# Patient Record
Sex: Male | Born: 1985 | Hispanic: Yes | Marital: Single | Smoking: Never smoker
Health system: Southern US, Community
[De-identification: ages and names within clinical notes are randomized; demographics above are authoritative.]

## PROBLEM LIST (undated history)

## (undated) DIAGNOSIS — Z789 Other specified health status: Secondary | ICD-10-CM

## (undated) HISTORY — DX: Other specified health status: Z78.9

---

## 2017-05-16 ENCOUNTER — Other Ambulatory Visit: Payer: Self-pay | Admitting: Internal Medicine

## 2017-05-16 DIAGNOSIS — Z8249 Family history of ischemic heart disease and other diseases of the circulatory system: Secondary | ICD-10-CM

## 2017-05-23 ENCOUNTER — Ambulatory Visit
Admission: RE | Admit: 2017-05-23 | Discharge: 2017-05-23 | Disposition: A | Discharge status: Home / Self Care | Payer: BLUE CROSS/BLUE SHIELD | Source: Ambulatory Visit | Attending: Internal Medicine | Admitting: Internal Medicine

## 2017-05-23 DIAGNOSIS — I719 Aortic aneurysm of unspecified site, without rupture: Secondary | ICD-10-CM | POA: Diagnosis not present

## 2017-05-23 DIAGNOSIS — Z8249 Family history of ischemic heart disease and other diseases of the circulatory system: Secondary | ICD-10-CM

## 2017-05-23 DIAGNOSIS — Z136 Encounter for screening for cardiovascular disorders: Secondary | ICD-10-CM | POA: Diagnosis not present

## 2017-05-24 ENCOUNTER — Telehealth: Payer: Self-pay | Admitting: Cardiovascular Disease

## 2017-05-24 NOTE — Telephone Encounter (Signed)
Received records from [facility] for appointment on 06/19/17 with Dr Luling.  Records put with Dr [place]'s schedule for 06/19/17. lp °

## 2017-06-19 ENCOUNTER — Encounter: Payer: Self-pay | Admitting: Cardiovascular Disease

## 2017-06-19 ENCOUNTER — Ambulatory Visit (INDEPENDENT_AMBULATORY_CARE_PROVIDER_SITE_OTHER): Payer: BLUE CROSS/BLUE SHIELD | Admitting: Cardiovascular Disease

## 2017-06-19 VITALS — BP 125/77 | HR 72 | Ht 71.0 in | Wt 155.4 lb

## 2017-06-19 DIAGNOSIS — Z01812 Encounter for preprocedural laboratory examination: Secondary | ICD-10-CM | POA: Diagnosis not present

## 2017-06-19 DIAGNOSIS — Z136 Encounter for screening for cardiovascular disorders: Secondary | ICD-10-CM

## 2017-06-19 DIAGNOSIS — Z8249 Family history of ischemic heart disease and other diseases of the circulatory system: Secondary | ICD-10-CM | POA: Diagnosis not present

## 2017-06-19 NOTE — Patient Instructions (Signed)
Labwork: BMET TODAY   Testing/Procedures: Non-Cardiac CT Angiography (CTA), is a special type of CT scan that uses a computer to produce multi-dimensional views of major blood vessels throughout the body. In CT angiography, a contrast material is injected through an IV to help visualize the blood vessels CTA CHEST   Follow-Up: AS NEEDED   If you need a refill on your cardiac medications before your next appointment, please call your pharmacy.

## 2017-06-19 NOTE — Progress Notes (Signed)
Cardiology Office Note   Date:  06/19/2017   ID:  Matthew Curtis, DOB Mar 04, 1986, MRN [id number]  PCP:  Gaspar Garbe, MD  Cardiologist:   Chilton Si, MD   Chief Complaint  Patient presents with  . New Patient (Initial Visit)    Pt states no Sx. Check up regarding family history.      History of Present Illness: Matthew Curtis is a 31 y.o. male with no past medical history who is being seen today for the evaluation of family history of CAD at the request of Tisovec, Adelfa Koh, MD.  Matthew Curtis brother had a thoracic aortic aneurysm at age 71.  He developed severe back pain and was found to have a severe thoracic aortic aneurysm that required emergency surgery.  He saw Dr. Wylene Curtis and reported that his brother had a AAA.  He was referred for an abdominal aortic ultrasound 05/23/17 that was negative for aortic aneurysm.  However, he later learned that it was actually a thoracic aneurysm. He reports that his brother had an incision that went from his sternum and into the abdomen.  He is unsure whether it was an ascending aneurysm.    Matthew Curtis has been feeling well.  He currently denies chest pain or shortness of breath.  He exercises at the gym 5 days per week and has no exertional symptoms.  Six months ago he had left sided chest pain in the setting of having some pustules in that region of his skin. He denies any exertional symptoms or recent chest pain.  He does not smoke and drinks alcohol 1-2 per week.  He denies any hyperextensible joints or skin.    Past Medical History:  Diagnosis Date  . Medical history non-contributory     No past surgical history on file.   No current outpatient prescriptions on file.   No current facility-administered medications for this visit.     Allergies:   Patient has no known allergies.    Social History:  The patient  reports that he has never smoked. He has never used smokeless tobacco.   Family History:  The patient's family  history includes Aortic aneurysm in his brother; Diabetes in his father, maternal grandfather, and maternal grandmother; Hypertension in his father; Osteoporosis in his maternal grandmother; Prostate cancer in his father and paternal grandfather; Stroke in his maternal grandfather.    ROS:  Please see the history of present illness.   Otherwise, review of systems are positive for none.   All other systems are reviewed and negative.    PHYSICAL EXAM: VS:  BP 125/77   Pulse 72   Ht 5\' 11"  (1.803 m)   Wt 70.5 kg (155 lb 6.4 oz)   BMI 21.67 kg/m  , BMI Body mass index is 21.67 kg/m. GENERAL:  Well appearing HEENT:  Pupils equal round and reactive, fundi not visualized, oral mucosa unremarkable.  No biphid uvula NECK:  No jugular venous distention, waveform within normal limits, carotid upstroke brisk and symmetric, no bruits, no thyromegaly LUNGS:  Clear to auscultation bilaterally HEART:  RRR.  PMI not displaced or sustained,S1 and S2 within normal limits, no S3, no S4, no clicks, no rubs, no murmurs ABD:  Flat, positive bowel sounds normal in frequency in pitch, no bruits, no rebound, no guarding, no midline pulsatile mass, no hepatomegaly, no splenomegaly EXT:  2 plus pulses throughout, no edema, no cyanosis no clubbing SKIN:  No rashes no nodules.  No hyperelasticity NEURO:  Cranial nerves II through XII grossly intact, motor grossly intact throughout PSYCH:  Cognitively intact, oriented to person place and time   EKG:  EKG is ordered today. The ekg ordered today demonstrates sinus arrhythmia. Rate 72 bpm.   Recent Labs: No results found for requested labs within last 8760 hours.   05/23/17: Sodium 141, potassium 4.3, BUN 23, creatinine 1.1 AST 21, ALT 18 WBC 7, hemoglobin 15.2, hematocrit 44, platelets 300 Total cholesterol 170, triglycerides 110, HDL 56, LDL 92  Lipid Panel No results found for: CHOL, TRIG, HDL, CHOLHDL, VLDL, LDLCALC, LDLDIRECT    Wt Readings from Last 3  Encounters:  06/19/17 70.5 kg (155 lb 6.4 oz)      ASSESSMENT AND PLAN:  # Family history of aortic aneurysm:  Matthew Curtis 34 year old brother recenty had a thoracic aortic aneurysm that required emergency surgery.  He is unsure of whether is was an ascending or descending aneurysm and he doesn't think he had a bicuspid aortic valve.  Matthew Curtis had an AAA that was negative for dissection, but this wouldn't have shown anything in the thoracic cavity.  We will get a chest CT-A to look at the ascending and descending aorta.  He doesn't have any evidence of connective tissue disease on exam.  # Atypical chest pain: Episode occurred 6 months ago and hasn't recurred recently despite regular exercise.  No ischemia evaluation.    Current medicines are reviewed at length with the patient today.  The patient does not have concerns regarding medicines.  The following changes have been made:  no change  Labs/ tests ordered today include:   Orders Placed This Encounter  Procedures  . CT ANGIO CHEST AORTA W/CM &/OR WO/CM  . Basic metabolic panel     Disposition:   FU with Altagracia Rone C. Duke Salvia, MD, [facility] as needed.     This note was written with the assistance of speech recognition software.  Please excuse any transcriptional errors.  Signed, Abdinasir Spadafore C. Duke Salvia, MD, [facility]  06/19/2017 2:35 PM    [facility]

## 2017-06-19 NOTE — Addendum Note (Signed)
Addended by: Regis Bill B on: 06/19/2017 06:04 PM   Modules accepted: Orders

## 2017-06-26 ENCOUNTER — Ambulatory Visit (INDEPENDENT_AMBULATORY_CARE_PROVIDER_SITE_OTHER)
Admission: RE | Admit: 2017-06-26 | Discharge: 2017-06-26 | Disposition: A | Discharge status: Home / Self Care | Payer: BLUE CROSS/BLUE SHIELD | Source: Ambulatory Visit | Attending: Cardiovascular Disease | Admitting: Cardiovascular Disease

## 2017-06-26 DIAGNOSIS — Z136 Encounter for screening for cardiovascular disorders: Secondary | ICD-10-CM

## 2017-06-26 DIAGNOSIS — Z8249 Family history of ischemic heart disease and other diseases of the circulatory system: Secondary | ICD-10-CM

## 2017-06-26 DIAGNOSIS — I712 Thoracic aortic aneurysm, without rupture: Secondary | ICD-10-CM | POA: Diagnosis not present

## 2017-06-26 MED ORDER — IOPAMIDOL (ISOVUE-370) INJECTION 76%
100.0000 mL | Freq: Once | INTRAVENOUS | Status: AC | PRN
  Administered 2017-06-26: 100 mL via INTRAVENOUS

## 2017-07-03 ENCOUNTER — Telehealth: Payer: Self-pay | Admitting: Cardiovascular Disease

## 2017-07-03 NOTE — Telephone Encounter (Signed)
Returned call and made aware of results, verbalized understanding.

## 2017-07-03 NOTE — Telephone Encounter (Signed)
New message     Pt is returning [place] call from Friday

## 2018-05-10 DIAGNOSIS — R82998 Other abnormal findings in urine: Secondary | ICD-10-CM | POA: Diagnosis not present

## 2018-05-10 DIAGNOSIS — Z0000 Encounter for general adult medical examination without abnormal findings: Secondary | ICD-10-CM | POA: Diagnosis not present

## 2018-05-17 DIAGNOSIS — Z0000 Encounter for general adult medical examination without abnormal findings: Secondary | ICD-10-CM | POA: Diagnosis not present

## 2018-05-17 DIAGNOSIS — Z1331 Encounter for screening for depression: Secondary | ICD-10-CM | POA: Diagnosis not present

## 2018-05-17 DIAGNOSIS — F418 Other specified anxiety disorders: Secondary | ICD-10-CM | POA: Diagnosis not present

## 2018-05-17 DIAGNOSIS — Z1389 Encounter for screening for other disorder: Secondary | ICD-10-CM | POA: Diagnosis not present

## 2018-05-17 DIAGNOSIS — Z8249 Family history of ischemic heart disease and other diseases of the circulatory system: Secondary | ICD-10-CM | POA: Diagnosis not present

## 2018-07-16 DIAGNOSIS — Z23 Encounter for immunization: Secondary | ICD-10-CM | POA: Diagnosis not present

## 2018-08-09 IMAGING — US US ABDOMINAL AORTA SCREENING AAA
1 series · 14 of 15 positions shown · non-contrast
Comparison: None.

CLINICAL DATA: Family history of aortic aneurysm.

EXAM:
ULTRASOUND OF ABDOMINAL AORTA
TECHNIQUE: Ultrasound examination of the abdominal aorta was performed to
evaluate for abdominal aortic aneurysm.

[Series 1: us abdominal aorta screening aaa · 0.17mm/px · 14 of 15 slices shown]
[im 1/15]
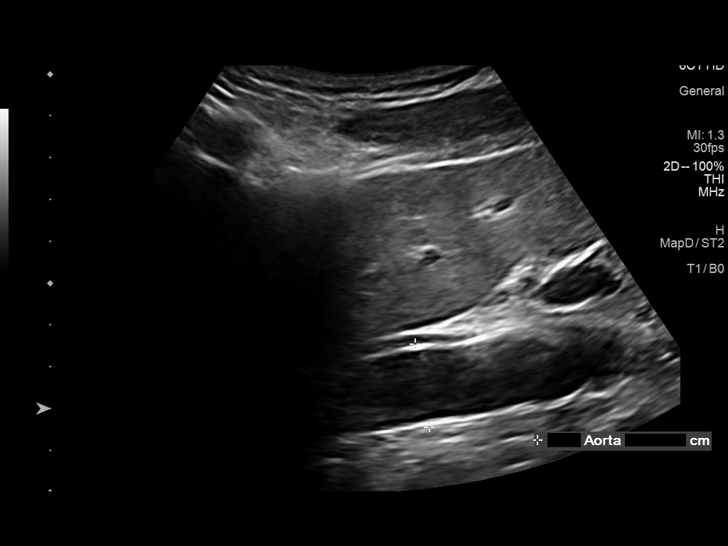
[im 2/15]
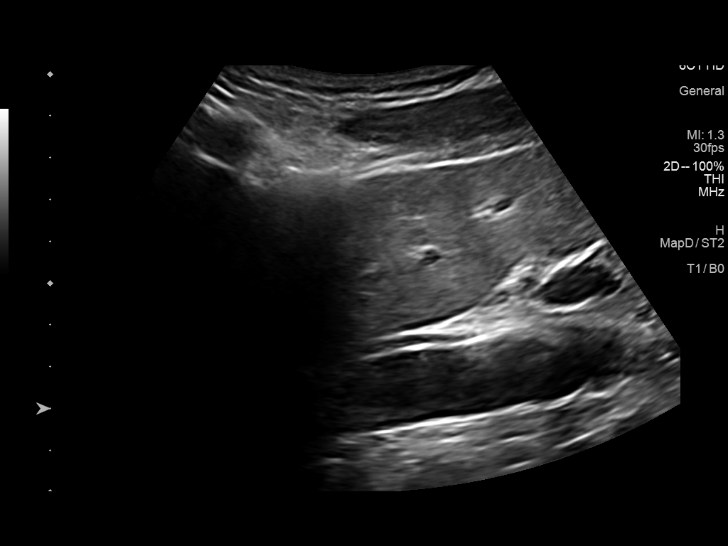
[im 3/15]
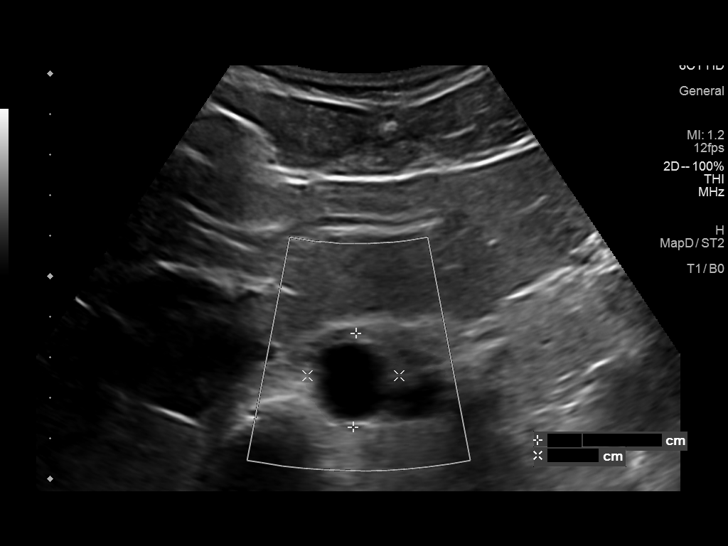
[im 4/15]
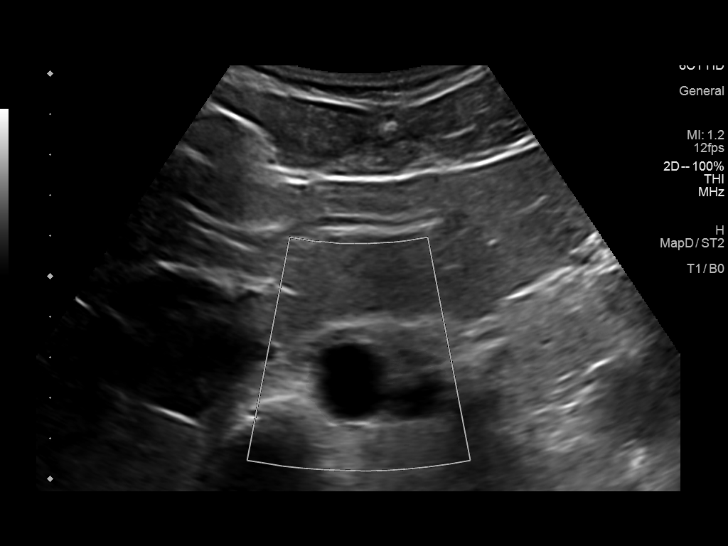
[im 5/15]
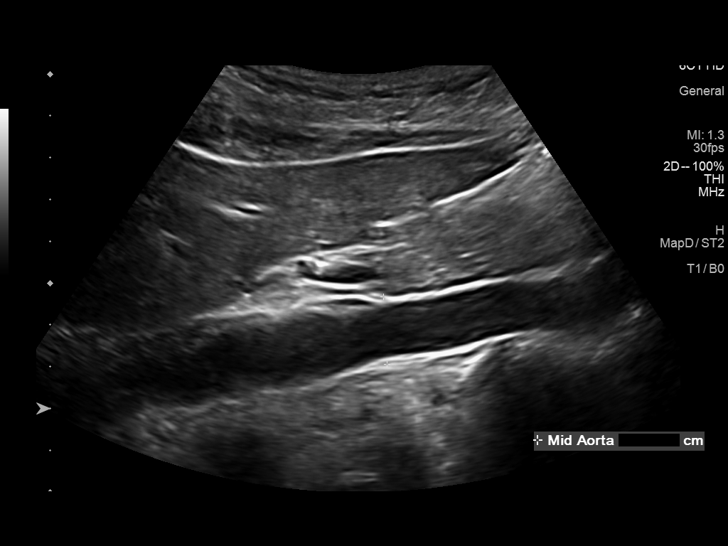
[im 6/15]
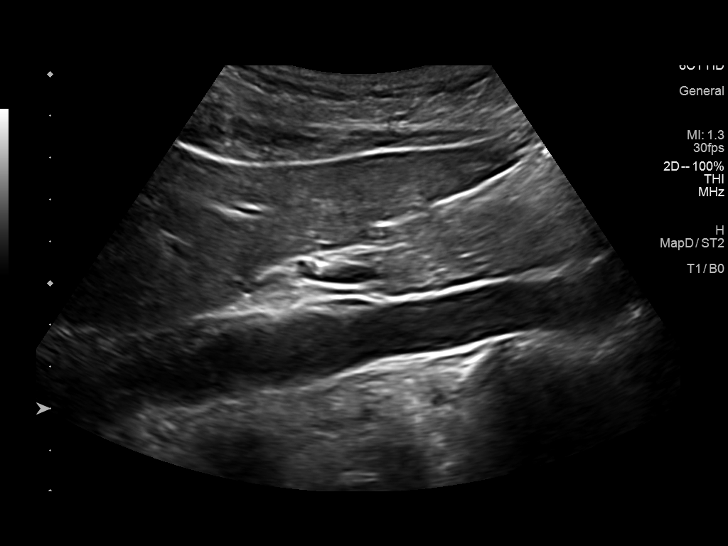
[im 7/15]
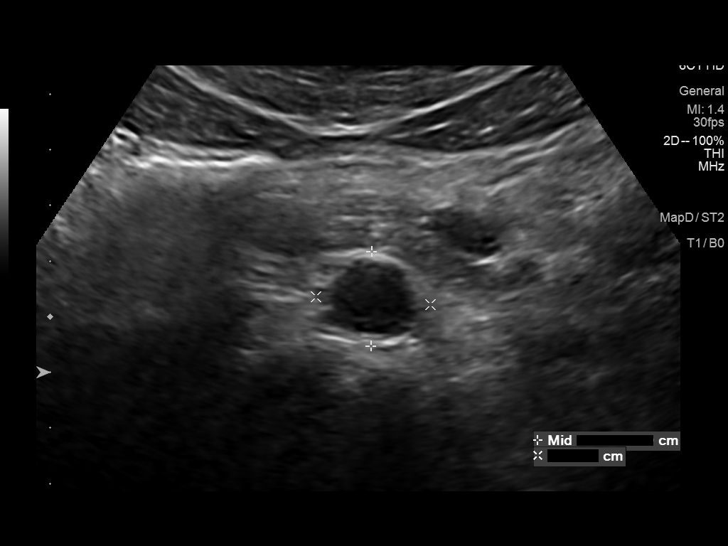
[im 9/15]
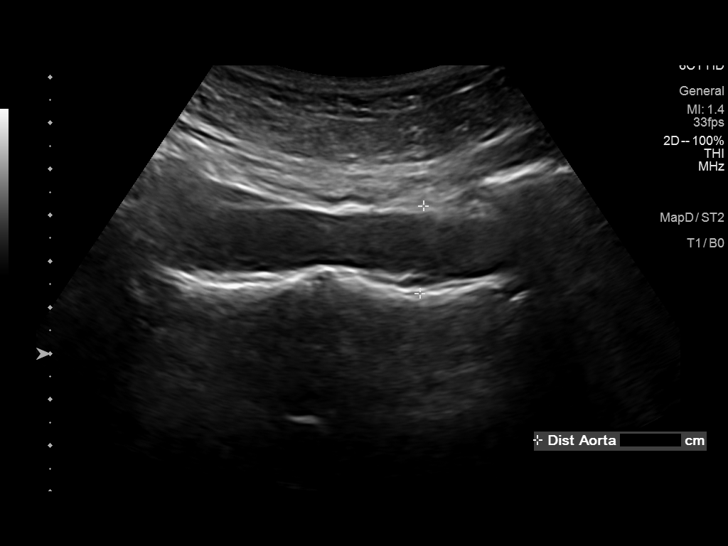
[im 10/15]
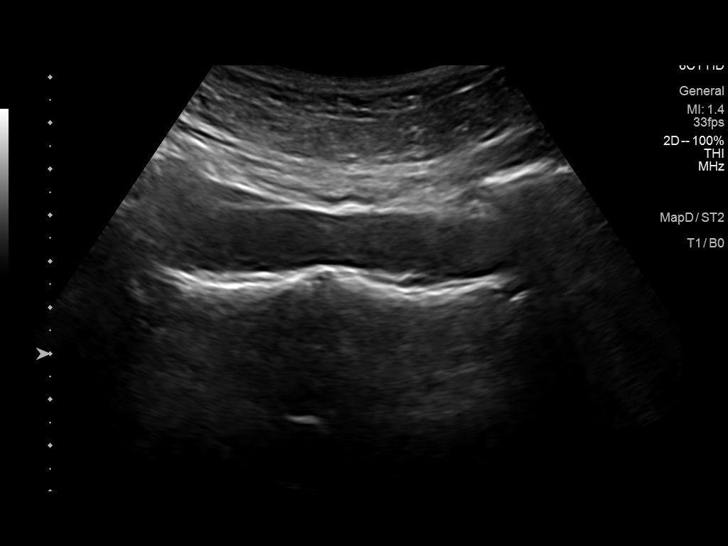
[im 11/15]
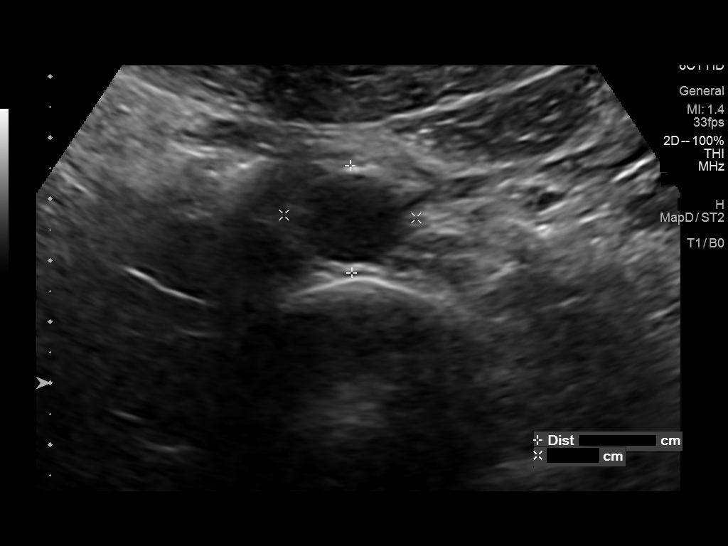
[im 12/15]
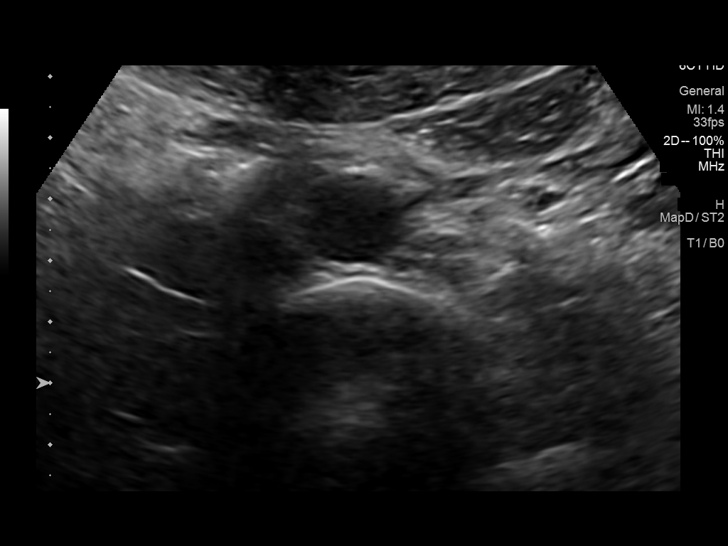
[im 13/15]
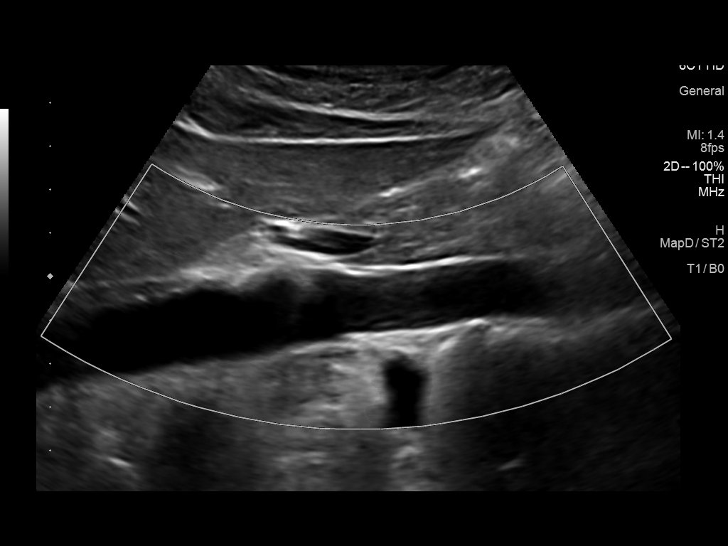
[im 14/15]
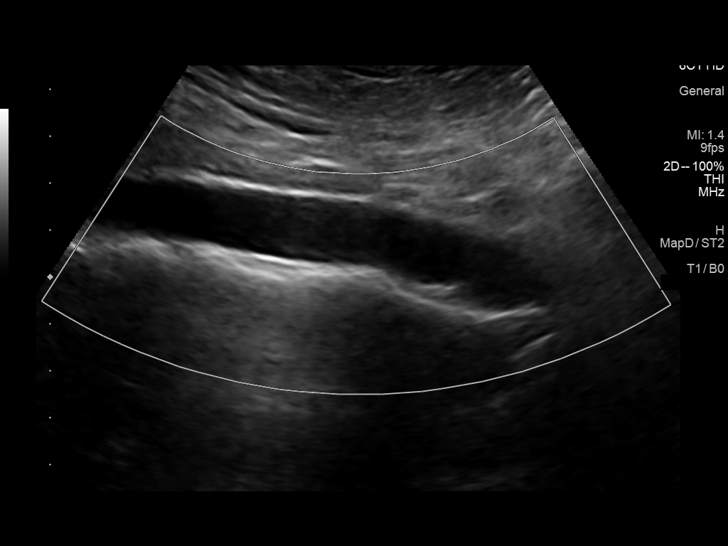
[im 15/15]
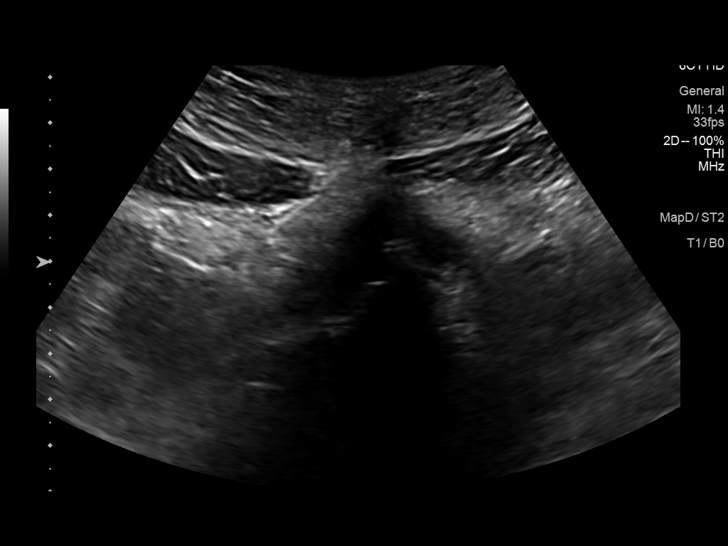

[14 of 15 positions shown; findings below may reference images not displayed]

FINDINGS: Abdominal aortic measurements as follows:

Proximal:  2.3 cm

Mid:  2.1 cm

Distal:  2.2 cm
IMPRESSION: No evidence of aortic aneurysm.

## 2018-09-25 ENCOUNTER — Encounter: Payer: Self-pay | Admitting: Family Medicine

## 2018-09-25 ENCOUNTER — Ambulatory Visit (INDEPENDENT_AMBULATORY_CARE_PROVIDER_SITE_OTHER): Payer: BLUE CROSS/BLUE SHIELD | Admitting: Family Medicine

## 2018-09-25 VITALS — BP 131/86 | Ht 71.0 in | Wt 155.0 lb

## 2018-09-25 DIAGNOSIS — M25511 Pain in right shoulder: Secondary | ICD-10-CM | POA: Diagnosis not present

## 2018-09-25 NOTE — Patient Instructions (Signed)
You have rotator cuff impingement Try to avoid painful activities (overhead activities, lifting with extended arm) as much as possible. Aleve 2 tabs twice a day with food OR ibuprofen 3 tabs three times a day with food for pain and inflammation - take for 7-10 more days regularly then as needed. Can take tylenol in addition to this. Subacromial injection may be beneficial to help with pain and to decrease inflammation. Consider physical therapy with transition to home exercise program. Do home exercise program with theraband and scapular stabilization exercises daily 3 sets of 10 once a day. If not improving at follow-up we will consider further imaging, injection, physical therapy, and/or nitro patches. Follow up with me in 1 month for reevaluation.

## 2018-09-26 ENCOUNTER — Encounter: Payer: Self-pay | Admitting: Family Medicine

## 2018-09-26 NOTE — Progress Notes (Signed)
PCP: Gaspar Garbeisovec, Richard W, MD  Subjective:   HPI: Patient is a 32 y.o. male here for right shoulder pain.  Patient reports about 8 months ago he was at the gym doing pullups when he felt a pinch in lateral shoulder area. He took some time off then started doing crossfit in may. Did ok until about 3 weeks ago when doing pullups felt dicomfort in same area. Stopped working out completely about 1.5 weeks ago. Pain worse and sharp with reaching out to the side and overhead. Has been taking advil, icing, using TENS unit which all have helped. No night pain. No prior issues with right shoulder. He is right handed.  Past Medical History:  Diagnosis Date  . Medical history non-contributory     No current outpatient medications on file prior to visit.   No current facility-administered medications on file prior to visit.     History reviewed. No pertinent surgical history.  No Known Allergies  Social History   Socioeconomic History  . Marital status: Married    Spouse name: Not on file  . Number of children: Not on file  . Years of education: Not on file  . Highest education level: Not on file  Occupational History  . Not on file  Social Needs  . Financial resource strain: Not on file  . Food insecurity:    Worry: Not on file    Inability: Not on file  . Transportation needs:    Medical: Not on file    Non-medical: Not on file  Tobacco Use  . Smoking status: Never Smoker  . Smokeless tobacco: Never Used  Substance and Sexual Activity  . Alcohol use: Not on file  . Drug use: Not on file  . Sexual activity: Not on file  Lifestyle  . Physical activity:    Days per week: Not on file    Minutes per session: Not on file  . Stress: Not on file  Relationships  . Social connections:    Talks on phone: Not on file    Gets together: Not on file    Attends religious service: Not on file    Active member of club or organization: Not on file    Attends meetings of clubs or  organizations: Not on file    Relationship status: Not on file  . Intimate partner violence:    Fear of current or ex partner: Not on file    Emotionally abused: Not on file    Physically abused: Not on file    Forced sexual activity: Not on file  Other Topics Concern  . Not on file  Social History Narrative  . Not on file    Family History  Problem Relation Age of Onset  . Prostate cancer Father   . Diabetes Father   . Hypertension Father   . Aortic aneurysm Brother   . Prostate cancer Paternal Grandfather   . Diabetes Maternal Grandfather   . Stroke Maternal Grandfather   . Osteoporosis Maternal Grandmother   . Diabetes Maternal Grandmother     BP 131/86   Ht 5\' 11"  (1.803 m)   Wt 155 lb (70.3 kg)   BMI 21.62 kg/m   Review of Systems: See HPI above.     Objective:  Physical Exam:  Gen: NAD, comfortable in exam room  Right shoulder: No swelling, ecchymoses.  No gross deformity. No TTP. FROM with painful arc. Negative Hawkins, Neers. Negative Yergasons. Strength 5/5 with empty can but painful; 5/5  resisted internal/external rotation. Negative apprehension. NV intact distally.  Left shoulder: No swelling, ecchymoses.  No gross deformity. No TTP. FROM. Strength 5/5 with empty can and resisted internal/external rotation. NV intact distally.   Assessment & Plan:  1. Right shoulder pain - 2/2 rotator cuff impingement.  Shown home exercises to do daily.  Aleve or ibuprofen for 7-10 days then as needed.  Consider injection, physical therapy, nitro patches.  F/u in 1 month.

## 2018-10-29 ENCOUNTER — Encounter: Payer: Self-pay | Admitting: Family Medicine

## 2018-10-29 ENCOUNTER — Ambulatory Visit (INDEPENDENT_AMBULATORY_CARE_PROVIDER_SITE_OTHER): Payer: BLUE CROSS/BLUE SHIELD | Admitting: Family Medicine

## 2018-10-29 VITALS — BP 116/84 | Ht 71.0 in | Wt 155.0 lb

## 2018-10-29 DIAGNOSIS — M25511 Pain in right shoulder: Secondary | ICD-10-CM | POA: Diagnosis not present

## 2018-10-29 NOTE — Progress Notes (Signed)
PCP: Gaspar Garbeisovec, Richard W, MD  Subjective:   HPI: Patient is a 33 y.o. male here for right shoulder pain.  12/4: Patient reports about 8 months ago he was at the gym doing pullups when he felt a pinch in lateral shoulder area. He took some time off then started doing crossfit in may. Did ok until about 3 weeks ago when doing pullups felt dicomfort in same area. Stopped working out completely about 1.5 weeks ago. Pain worse and sharp with reaching out to the side and overhead. Has been taking advil, icing, using TENS unit which all have helped. No night pain. No prior issues with right shoulder. He is right handed.  10/29/18: Patient reports he's doing well. Doing home exercises. Minimal pain but hasn't tested this in the gym. No current complaints. No skin changes.  Past Medical History:  Diagnosis Date  . Medical history non-contributory     No current outpatient medications on file prior to visit.   No current facility-administered medications on file prior to visit.     History reviewed. No pertinent surgical history.  No Known Allergies  Social History   Socioeconomic History  . Marital status: Married    Spouse name: Not on file  . Number of children: Not on file  . Years of education: Not on file  . Highest education level: Not on file  Occupational History  . Not on file  Social Needs  . Financial resource strain: Not on file  . Food insecurity:    Worry: Not on file    Inability: Not on file  . Transportation needs:    Medical: Not on file    Non-medical: Not on file  Tobacco Use  . Smoking status: Never Smoker  . Smokeless tobacco: Never Used  Substance and Sexual Activity  . Alcohol use: Not on file  . Drug use: Not on file  . Sexual activity: Not on file  Lifestyle  . Physical activity:    Days per week: Not on file    Minutes per session: Not on file  . Stress: Not on file  Relationships  . Social connections:    Talks on phone: Not on  file    Gets together: Not on file    Attends religious service: Not on file    Active member of club or organization: Not on file    Attends meetings of clubs or organizations: Not on file    Relationship status: Not on file  . Intimate partner violence:    Fear of current or ex partner: Not on file    Emotionally abused: Not on file    Physically abused: Not on file    Forced sexual activity: Not on file  Other Topics Concern  . Not on file  Social History Narrative  . Not on file    Family History  Problem Relation Age of Onset  . Prostate cancer Father   . Diabetes Father   . Hypertension Father   . Aortic aneurysm Brother   . Prostate cancer Paternal Grandfather   . Diabetes Maternal Grandfather   . Stroke Maternal Grandfather   . Osteoporosis Maternal Grandmother   . Diabetes Maternal Grandmother     BP 116/84   Ht 5\' 11"  (1.803 m)   Wt 155 lb (70.3 kg)   BMI 21.62 kg/m   Review of Systems: See HPI above.     Objective:  Physical Exam:  Gen: NAD, comfortable in exam room  Right shoulder:  No swelling, ecchymoses.  No gross deformity. No TTP. FROM with minimal painful arc. Negative Hawkins, Neers. Negative Yergasons. Strength 5/5 with empty can and resisted internal/external rotation. Negative apprehension. NV intact distally.   Assessment & Plan:  1. Right shoulder pain - 2/2 rotator cuff impingement.  Continue HEP 3x/week for 6 more weeks.  Reviewed return to gym and how to proceed.  F/u prn.

## 2018-10-29 NOTE — Patient Instructions (Signed)
You're doing great! Ease back into the gym like we talked - I'd wait 2-4 weeks before attempting pullups or lat pull-downs but can ease into these motions with dumbbell overhead presses, light weight to start. Do home exercises 3 sets of 10 still about 3 times a week for 6 more weeks. Follow up with me as needed.

## 2019-05-13 DIAGNOSIS — Z0000 Encounter for general adult medical examination without abnormal findings: Secondary | ICD-10-CM | POA: Diagnosis not present

## 2019-05-20 DIAGNOSIS — Z8249 Family history of ischemic heart disease and other diseases of the circulatory system: Secondary | ICD-10-CM | POA: Diagnosis not present

## 2019-05-20 DIAGNOSIS — Z1331 Encounter for screening for depression: Secondary | ICD-10-CM | POA: Diagnosis not present

## 2019-05-20 DIAGNOSIS — Z0000 Encounter for general adult medical examination without abnormal findings: Secondary | ICD-10-CM | POA: Diagnosis not present

## 2019-05-20 DIAGNOSIS — F419 Anxiety disorder, unspecified: Secondary | ICD-10-CM | POA: Diagnosis not present

## 2019-08-07 DIAGNOSIS — Z23 Encounter for immunization: Secondary | ICD-10-CM | POA: Diagnosis not present

## 2019-09-10 DIAGNOSIS — S91331A Puncture wound without foreign body, right foot, initial encounter: Secondary | ICD-10-CM | POA: Diagnosis not present

## 2019-09-10 DIAGNOSIS — T6394XA Toxic effect of contact with unspecified venomous animal, undetermined, initial encounter: Secondary | ICD-10-CM | POA: Diagnosis not present

## 2019-09-11 ENCOUNTER — Ambulatory Visit: Payer: BC Managed Care – PPO | Admitting: Podiatry

## 2019-09-24 ENCOUNTER — Ambulatory Visit (INDEPENDENT_AMBULATORY_CARE_PROVIDER_SITE_OTHER): Payer: BC Managed Care – PPO | Admitting: Podiatry

## 2019-09-24 ENCOUNTER — Ambulatory Visit: Payer: BC Managed Care – PPO

## 2019-09-24 ENCOUNTER — Ambulatory Visit (INDEPENDENT_AMBULATORY_CARE_PROVIDER_SITE_OTHER): Payer: BC Managed Care – PPO

## 2019-09-24 ENCOUNTER — Other Ambulatory Visit: Payer: Self-pay

## 2019-09-24 ENCOUNTER — Other Ambulatory Visit: Payer: Self-pay | Admitting: Podiatry

## 2019-09-24 ENCOUNTER — Encounter: Payer: Self-pay | Admitting: Podiatry

## 2019-09-24 DIAGNOSIS — S9031XA Contusion of right foot, initial encounter: Secondary | ICD-10-CM

## 2019-09-24 DIAGNOSIS — M722 Plantar fascial fibromatosis: Secondary | ICD-10-CM

## 2019-09-24 NOTE — Progress Notes (Signed)
Subjective:   Patient ID: Matthew Curtis, male   DOB: 33 y.o.   MRN: [id number]   HPI Patient presents stating that he stepped on a stinger into the bottom of his right heel and he had some redness and swelling and was on antibiotics and that has gone away but it continues to bother him in the heel and its been around 6 weeks.  Patient does not smoke and likes to be active and states it is improved but there is an area that remains inflamed with no drainage   Review of Systems  All other systems reviewed and are negative.       Objective:  Physical Exam Vitals signs and nursing note reviewed.  Constitutional:      Appearance: He is well-developed.  Pulmonary:     Effort: Pulmonary effort is normal.  Musculoskeletal: Normal range of motion.  Skin:    General: Skin is warm.  Neurological:     Mental Status: He is alert.     Neurovascular status intact muscle strength was found to be adequate range of motion within normal limits.  Patient has a small opening where he did step on what appears to be a small stingray but there is no drainage currently and it has healed over and there is no erythema noted but there is some discomfort in the medial band of the plantar fascia     Assessment:  Possibility that we are dealing with some form of fasciitis here which may be secondary to inflammation that occurred with no current indication of infection     Plan:  H&P reviewed different conditions and the fact there is no long-term guarantees that there is not a foreign body in the area but nothing apparent currently.  To try to reduce the inflammation I carefully did do a medial injection of the fascia 3 mg Dexasone Kenalog 5 mg Xylocaine and advised on supportive shoes and if symptoms persist he is to let us know or if any issues were to occur he is to let us know  X-rays indicate that there is no indication of foreign body material currently

## 2019-09-24 NOTE — Progress Notes (Signed)
   Subjective:    Patient ID: Matthew Curtis, male    DOB: [birth year], 33 y.o.   MRN: [id number]  HPI    Review of Systems  All other systems reviewed and are negative.      Objective:   Physical Exam        Assessment & Plan:

## 2019-10-06 ENCOUNTER — Other Ambulatory Visit: Payer: Self-pay

## 2019-10-06 ENCOUNTER — Ambulatory Visit (INDEPENDENT_AMBULATORY_CARE_PROVIDER_SITE_OTHER): Payer: BC Managed Care – PPO | Admitting: Podiatry

## 2019-10-06 ENCOUNTER — Encounter: Payer: Self-pay | Admitting: Podiatry

## 2019-10-06 DIAGNOSIS — M722 Plantar fascial fibromatosis: Secondary | ICD-10-CM | POA: Diagnosis not present

## 2019-10-06 DIAGNOSIS — S9031XA Contusion of right foot, initial encounter: Secondary | ICD-10-CM | POA: Diagnosis not present

## 2019-10-08 NOTE — Progress Notes (Signed)
Subjective:   Patient ID: Matthew Curtis, male   DOB: 33 y.o.   MRN: [id number]   HPI Patient presents stating that my heel still bothers me a little bit but it is quite a bit better   ROS      Objective:  Physical Exam  Neurovascular status intact with small contusion on the right plantar medial heel that healed with no redness or drainage and mild discomfort     Assessment:  This may be just irritated tissue from the postoperative injury that he experienced or it could indicate there is a small foreign body still present      Plan:  Reviewed condition and recommended that he continue with soaks and supportive shoes and this should go away completely and if any issues were to occur patient is to let us know

## 2019-11-30 DIAGNOSIS — L03111 Cellulitis of right axilla: Secondary | ICD-10-CM | POA: Diagnosis not present

## 2020-05-18 DIAGNOSIS — Z0000 Encounter for general adult medical examination without abnormal findings: Secondary | ICD-10-CM | POA: Diagnosis not present

## 2020-05-24 DIAGNOSIS — Z0000 Encounter for general adult medical examination without abnormal findings: Secondary | ICD-10-CM | POA: Diagnosis not present

## 2020-05-24 DIAGNOSIS — B360 Pityriasis versicolor: Secondary | ICD-10-CM | POA: Diagnosis not present

## 2020-05-30 DIAGNOSIS — S59911A Unspecified injury of right forearm, initial encounter: Secondary | ICD-10-CM | POA: Diagnosis not present

## 2020-06-02 DIAGNOSIS — M25531 Pain in right wrist: Secondary | ICD-10-CM | POA: Diagnosis not present

## 2020-06-09 DIAGNOSIS — M25531 Pain in right wrist: Secondary | ICD-10-CM | POA: Diagnosis not present

## 2020-06-18 DIAGNOSIS — M25531 Pain in right wrist: Secondary | ICD-10-CM | POA: Diagnosis not present

## 2020-07-09 DIAGNOSIS — M25531 Pain in right wrist: Secondary | ICD-10-CM | POA: Diagnosis not present

## 2020-08-03 DIAGNOSIS — M25531 Pain in right wrist: Secondary | ICD-10-CM | POA: Diagnosis not present

## 2020-08-05 DIAGNOSIS — M25531 Pain in right wrist: Secondary | ICD-10-CM | POA: Diagnosis not present

## 2020-08-24 DIAGNOSIS — M25531 Pain in right wrist: Secondary | ICD-10-CM | POA: Diagnosis not present

## 2020-08-26 DIAGNOSIS — M25531 Pain in right wrist: Secondary | ICD-10-CM | POA: Diagnosis not present

## 2020-11-26 DIAGNOSIS — M25531 Pain in right wrist: Secondary | ICD-10-CM | POA: Diagnosis not present

## 2020-12-06 DIAGNOSIS — M25631 Stiffness of right wrist, not elsewhere classified: Secondary | ICD-10-CM | POA: Diagnosis not present
